# Patient Record
Sex: Female | Born: 1980 | Race: Black or African American | Hispanic: No | Marital: Married | Smoking: Never smoker
Health system: Southern US, Community
[De-identification: ages and names within clinical notes are randomized; demographics above are authoritative.]

## PROBLEM LIST (undated history)

## (undated) DIAGNOSIS — Z789 Other specified health status: Secondary | ICD-10-CM

---

## 2001-08-22 ENCOUNTER — Emergency Department (HOSPITAL_COMMUNITY): Admission: EM | Admit: 2001-08-22 | Discharge: 2001-08-22 | Payer: Self-pay | Admitting: Emergency Medicine

## 2001-08-23 ENCOUNTER — Emergency Department (HOSPITAL_COMMUNITY): Admission: EM | Admit: 2001-08-23 | Discharge: 2001-08-23 | Payer: Self-pay | Admitting: Emergency Medicine

## 2007-06-27 ENCOUNTER — Other Ambulatory Visit: Admission: RE | Admit: 2007-06-27 | Discharge: 2007-06-27 | Payer: Self-pay | Admitting: Family Medicine

## 2008-06-30 ENCOUNTER — Other Ambulatory Visit: Admission: RE | Admit: 2008-06-30 | Discharge: 2008-06-30 | Payer: Self-pay | Admitting: Family Medicine

## 2009-05-19 ENCOUNTER — Emergency Department (HOSPITAL_COMMUNITY): Admission: EM | Admit: 2009-05-19 | Discharge: 2009-05-19 | Payer: Self-pay | Admitting: Emergency Medicine

## 2009-07-15 ENCOUNTER — Ambulatory Visit (HOSPITAL_COMMUNITY): Admission: RE | Admit: 2009-07-15 | Discharge: 2009-07-15 | Payer: Self-pay | Admitting: Obstetrics & Gynecology

## 2009-08-05 ENCOUNTER — Ambulatory Visit (HOSPITAL_COMMUNITY): Admission: RE | Admit: 2009-08-05 | Discharge: 2009-08-05 | Payer: Self-pay | Admitting: Obstetrics & Gynecology

## 2009-11-19 ENCOUNTER — Inpatient Hospital Stay (HOSPITAL_COMMUNITY): Admission: AD | Admit: 2009-11-19 | Discharge: 2009-11-21 | Payer: Self-pay | Admitting: Obstetrics and Gynecology

## 2009-11-19 ENCOUNTER — Ambulatory Visit: Payer: Self-pay | Admitting: Obstetrics and Gynecology

## 2009-11-19 DIAGNOSIS — Z22330 Carrier of Group B streptococcus: Secondary | ICD-10-CM

## 2009-11-19 DIAGNOSIS — O9989 Other specified diseases and conditions complicating pregnancy, childbirth and the puerperium: Secondary | ICD-10-CM

## 2009-11-19 DIAGNOSIS — O99891 Other specified diseases and conditions complicating pregnancy: Secondary | ICD-10-CM

## 2009-11-19 DIAGNOSIS — O4700 False labor before 37 completed weeks of gestation, unspecified trimester: Secondary | ICD-10-CM

## 2010-01-19 ENCOUNTER — Inpatient Hospital Stay (HOSPITAL_COMMUNITY): Admission: AD | Admit: 2010-01-19 | Discharge: 2010-01-22 | Payer: Self-pay | Admitting: Obstetrics & Gynecology

## 2010-01-19 ENCOUNTER — Encounter (INDEPENDENT_AMBULATORY_CARE_PROVIDER_SITE_OTHER): Payer: Self-pay | Admitting: Obstetrics & Gynecology

## 2010-01-21 ENCOUNTER — Encounter
Admission: RE | Admit: 2010-01-21 | Discharge: 2010-02-20 | Payer: Self-pay | Source: Home / Self Care | Admitting: Obstetrics and Gynecology

## 2010-02-21 ENCOUNTER — Encounter
Admission: RE | Admit: 2010-02-21 | Discharge: 2010-03-23 | Payer: Self-pay | Source: Home / Self Care | Attending: Obstetrics and Gynecology | Admitting: Obstetrics and Gynecology

## 2010-03-24 ENCOUNTER — Encounter
Admission: RE | Admit: 2010-03-24 | Discharge: 2010-04-20 | Payer: Self-pay | Source: Home / Self Care | Attending: Obstetrics and Gynecology | Admitting: Obstetrics and Gynecology

## 2010-04-24 ENCOUNTER — Encounter (HOSPITAL_COMMUNITY)
Admission: RE | Admit: 2010-04-24 | Discharge: 2010-04-24 | Disposition: A | Discharge status: Home / Self Care | Payer: BC Managed Care – PPO | Source: Ambulatory Visit | Attending: Obstetrics and Gynecology | Admitting: Obstetrics and Gynecology

## 2010-04-24 DIAGNOSIS — O923 Agalactia: Secondary | ICD-10-CM | POA: Insufficient documentation

## 2010-06-01 LAB — CBC
HCT: 23.3 % — ABNORMAL LOW (ref 36.0–46.0)
Hemoglobin: 10.6 g/dL — ABNORMAL LOW (ref 12.0–15.0)
MCH: 28 pg (ref 26.0–34.0)
MCHC: 33.1 g/dL (ref 30.0–36.0)
MCV: 84.5 fL (ref 78.0–100.0)
Platelets: 213 10*3/uL (ref 150–400)
RDW: 16.3 % — ABNORMAL HIGH (ref 11.5–15.5)
WBC: 14.1 10*3/uL — ABNORMAL HIGH (ref 4.0–10.5)

## 2010-06-01 LAB — RPR: RPR Ser Ql: NONREACTIVE

## 2010-06-03 LAB — URINALYSIS, ROUTINE W REFLEX MICROSCOPIC
Bilirubin Urine: NEGATIVE
Glucose, UA: NEGATIVE mg/dL
Ketones, ur: 40 mg/dL — AB
Leukocytes, UA: NEGATIVE
Nitrite: NEGATIVE
Protein, ur: NEGATIVE mg/dL
Urobilinogen, UA: 0.2 mg/dL (ref 0.0–1.0)
pH: 6 (ref 5.0–8.0)

## 2010-06-03 LAB — CBC
MCH: 27.9 pg (ref 26.0–34.0)
MCV: 85.4 fL (ref 78.0–100.0)
RBC: 3.73 MIL/uL — ABNORMAL LOW (ref 3.87–5.11)
RDW: 14.4 % (ref 11.5–15.5)

## 2010-06-03 LAB — URINE MICROSCOPIC-ADD ON

## 2010-06-03 LAB — FETAL FIBRONECTIN: Fetal Fibronectin: POSITIVE — AB

## 2010-06-08 ENCOUNTER — Other Ambulatory Visit: Payer: Self-pay | Admitting: Obstetrics and Gynecology

## 2010-06-09 ENCOUNTER — Other Ambulatory Visit: Payer: Self-pay | Admitting: Obstetrics and Gynecology

## 2010-06-09 DIAGNOSIS — N63 Unspecified lump in unspecified breast: Secondary | ICD-10-CM

## 2010-06-14 ENCOUNTER — Ambulatory Visit
Admission: RE | Admit: 2010-06-14 | Discharge: 2010-06-14 | Disposition: A | Discharge status: Home / Self Care | Payer: BC Managed Care – PPO | Source: Ambulatory Visit | Attending: Obstetrics and Gynecology | Admitting: Obstetrics and Gynecology

## 2010-06-14 DIAGNOSIS — N63 Unspecified lump in unspecified breast: Secondary | ICD-10-CM

## 2010-06-14 LAB — DIFFERENTIAL
Lymphocytes Relative: 17 % (ref 12–46)
Monocytes Absolute: 0.7 10*3/uL (ref 0.1–1.0)
Monocytes Relative: 6 % (ref 3–12)
Neutro Abs: 8.4 10*3/uL — ABNORMAL HIGH (ref 1.7–7.7)

## 2010-06-14 LAB — WET PREP, GENITAL: Yeast Wet Prep HPF POC: NONE SEEN

## 2010-06-14 LAB — CBC
MCHC: 31.5 g/dL (ref 30.0–36.0)
Platelets: 286 10*3/uL (ref 150–400)
RBC: 4.76 MIL/uL (ref 3.87–5.11)
RDW: 14.3 % (ref 11.5–15.5)
WBC: 11.1 10*3/uL — ABNORMAL HIGH (ref 4.0–10.5)

## 2010-06-14 LAB — PREGNANCY, URINE: Preg Test, Ur: POSITIVE

## 2010-06-14 LAB — BASIC METABOLIC PANEL
BUN: 9 mg/dL (ref 6–23)
CO2: 22 mEq/L (ref 19–32)
Chloride: 104 mEq/L (ref 96–112)
Potassium: 3.4 mEq/L — ABNORMAL LOW (ref 3.5–5.1)

## 2010-06-14 LAB — URINALYSIS, ROUTINE W REFLEX MICROSCOPIC
Bilirubin Urine: NEGATIVE
Nitrite: NEGATIVE
Urobilinogen, UA: 0.2 mg/dL (ref 0.0–1.0)

## 2010-06-14 LAB — HCG, QUANTITATIVE, PREGNANCY: hCG, Beta Chain, Quant, S: 821 m[IU]/mL — ABNORMAL HIGH (ref <5)

## 2010-07-01 IMAGING — US US OB NUCHAL TRANSLUCENCY 1ST GEST
1 series · 14 of 27 positions shown · non-contrast
Comparison: none

OBSTETRICAL ULTRASOUND:
 This ultrasound was performed in The [HOSPITAL], and the [facility] report will be stored to [REDACTED] PACS.  This report is also available in [HOSPITAL]?s accessANYware.

[Series 1: us ob nuchal translucency 1st gest · 14 of 27 slices shown]
[im 1/27]
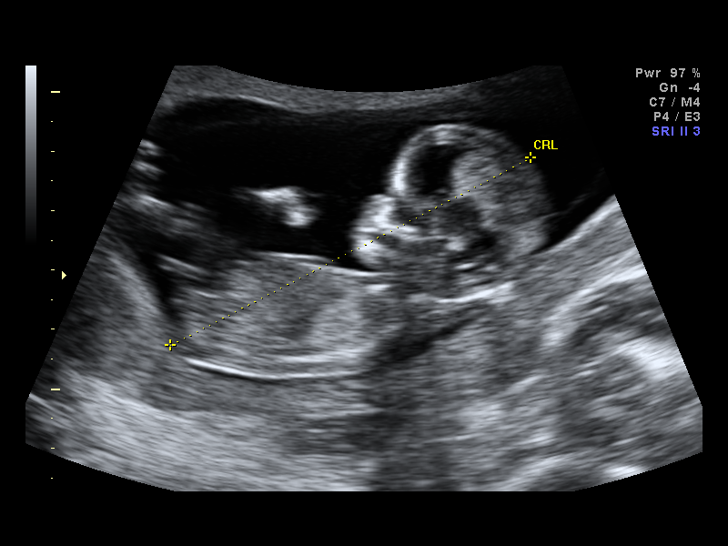
[im 3/27]
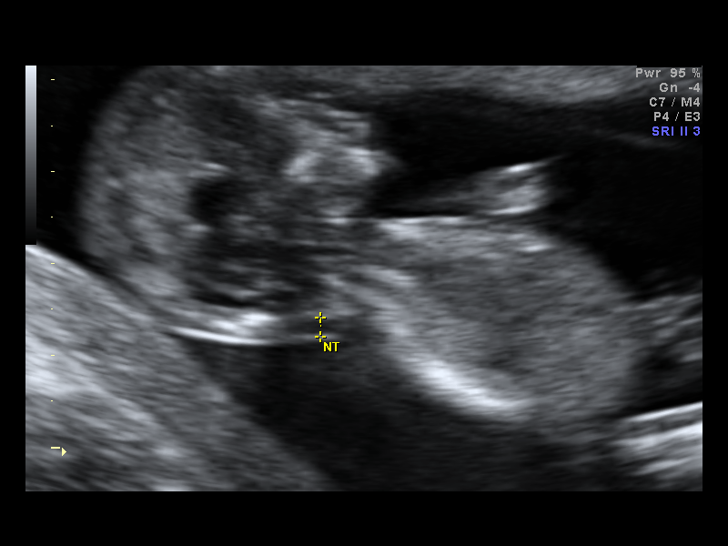
[im 5/27]
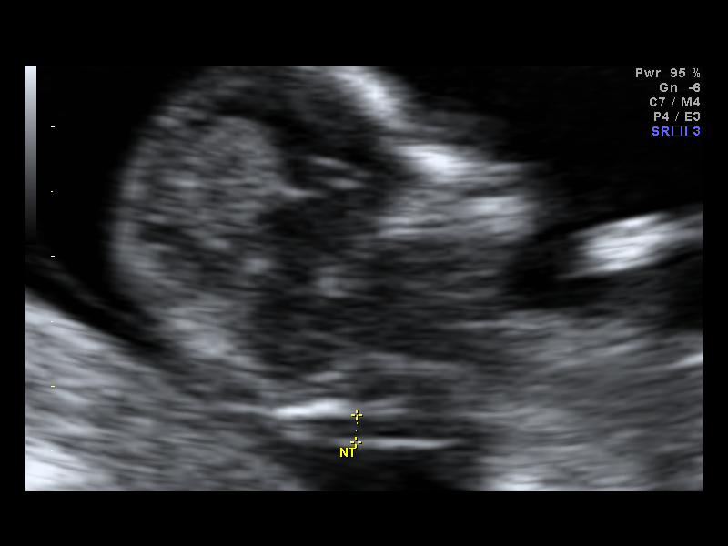
[im 7/27]
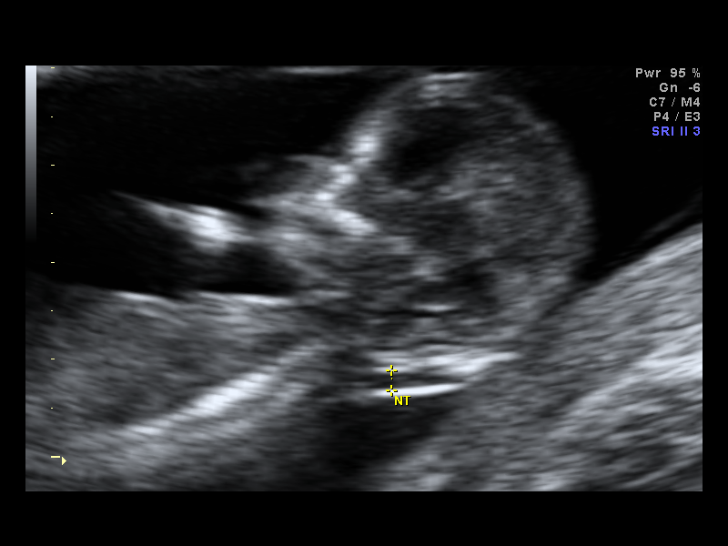
[im 9/27]
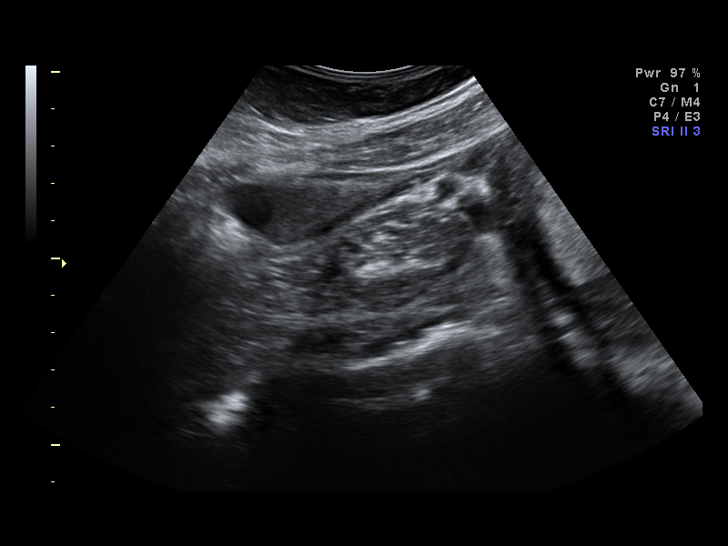
[im 11/27]
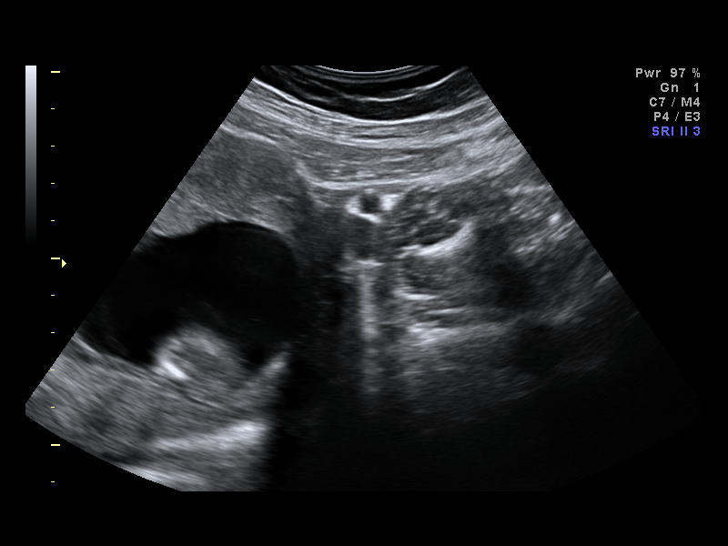
[im 13/27]
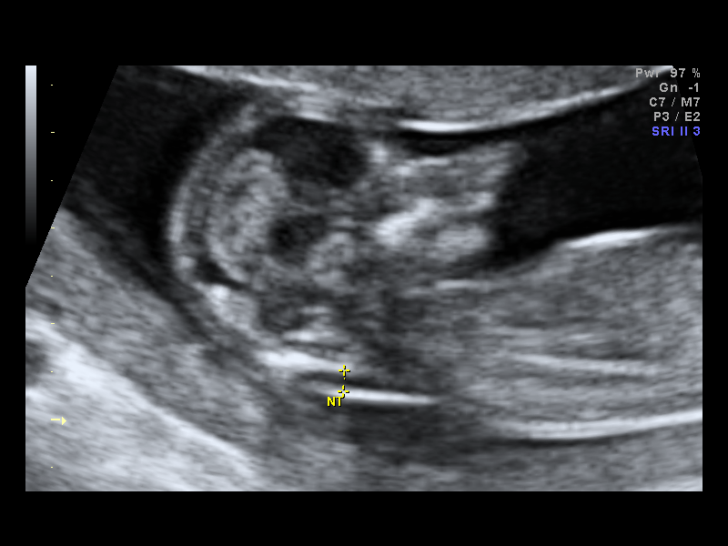
[im 15/27]
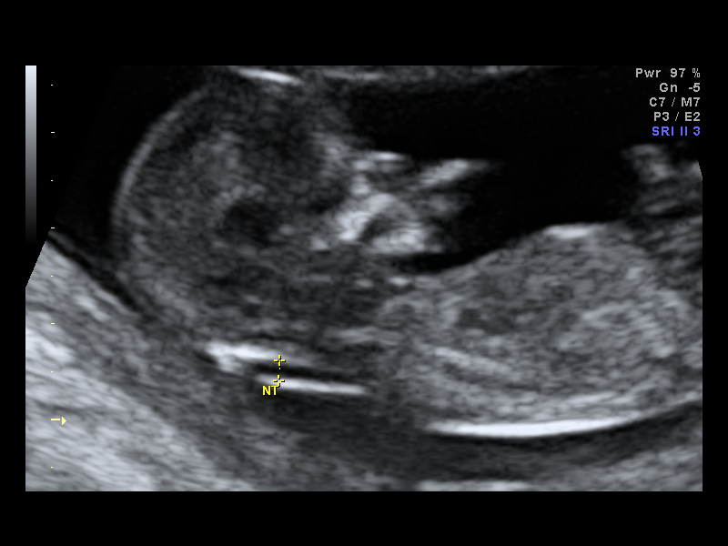
[im 17/27]
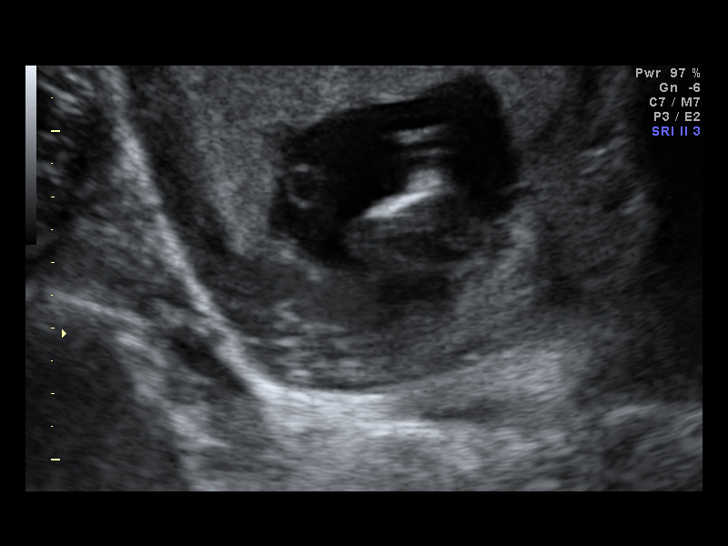
[im 19/27]
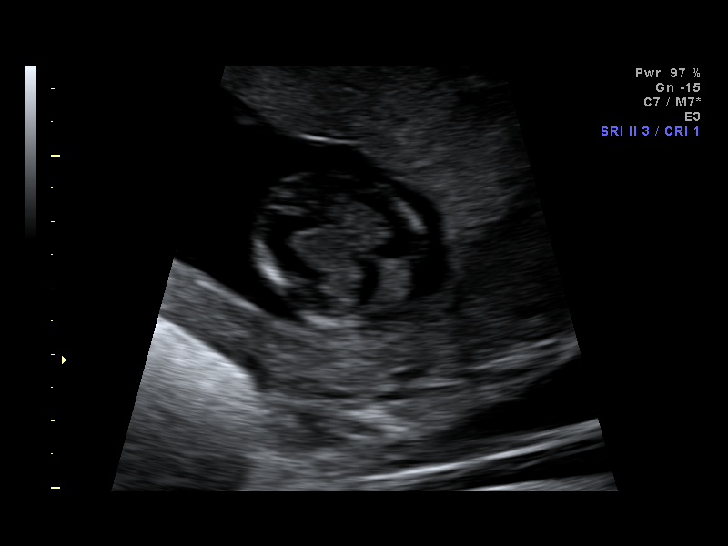
[im 21/27]
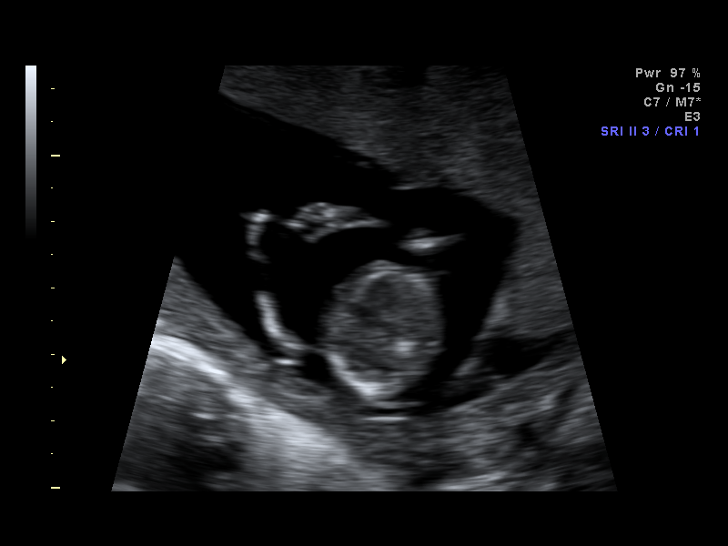
[im 23/27]
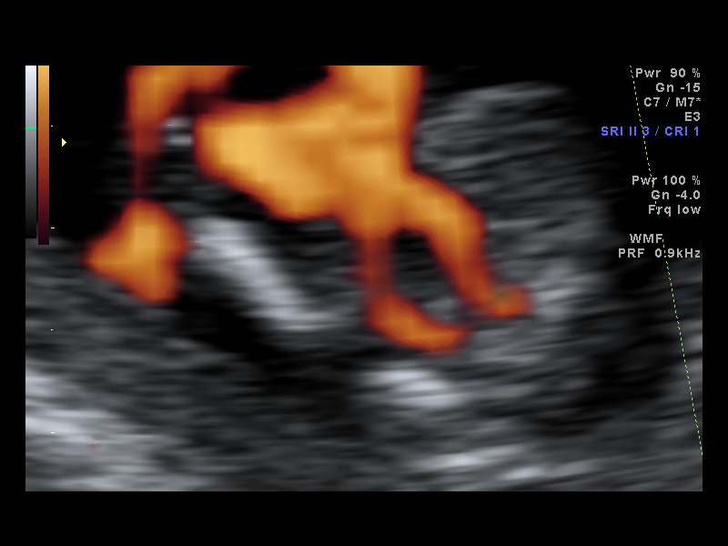
[im 25/27]
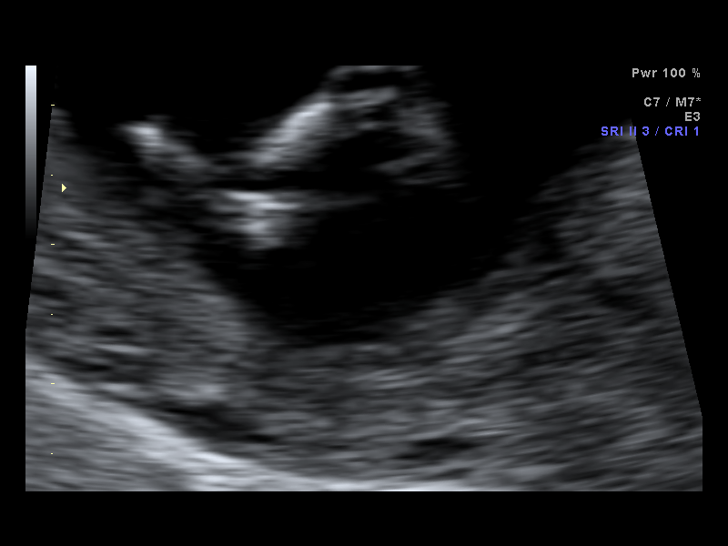
[im 27/27]
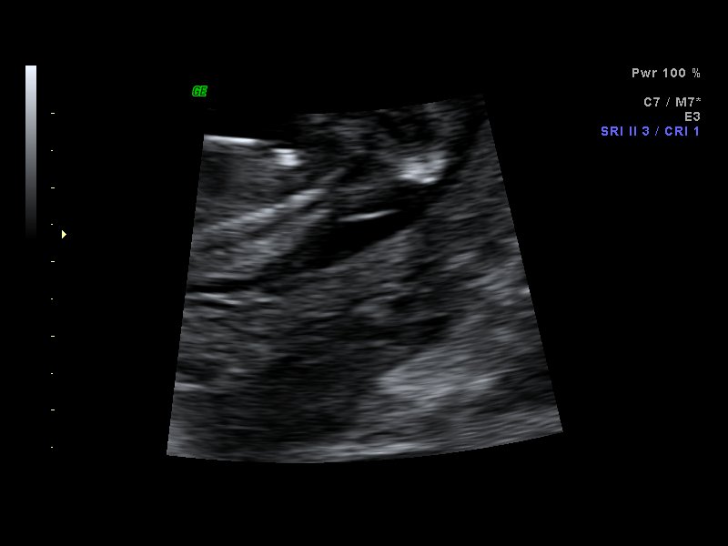

[14 of 27 positions shown; findings below may reference images not displayed]

IMPRESSION: [facility] has also been faxed to the ordering physician.

## 2010-11-05 IMAGING — US US OB COMP +14 WK
1 series · 14 of 28 positions shown · non-contrast
Comparison: none

OBSTETRICAL ULTRASOUND:
 This ultrasound exam was performed in the [HOSPITAL] Ultrasound Department.  The OB US report was generated in the [facility] system, and faxed to the ordering physician.  This report is also available in [HOSPITAL]?s AccessANYware and in [REDACTED] PACS.

[Series 1: us ob comp +14 wk · 0.41mm/px · 48 acquisitions, 14 frames shown]
[im 2/48]
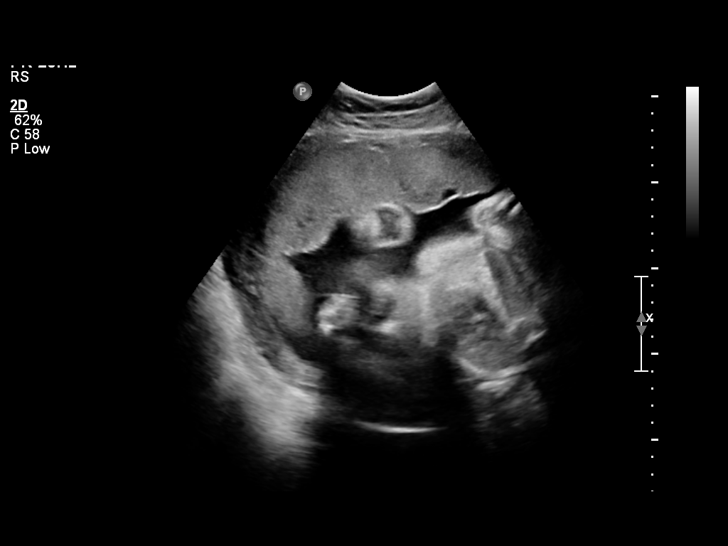
[im 6/48]
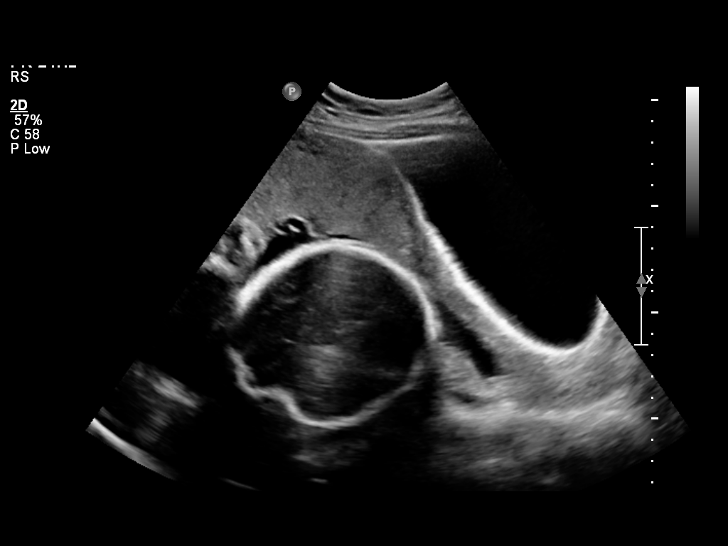
[im 9/48]
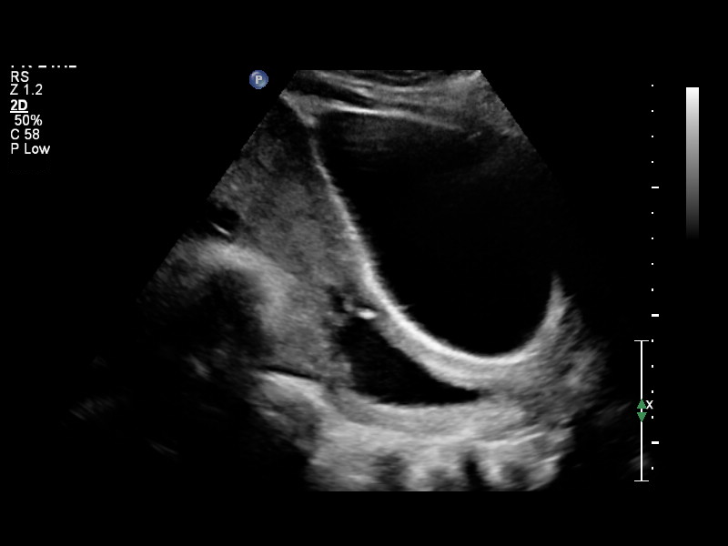
[im 13/48]
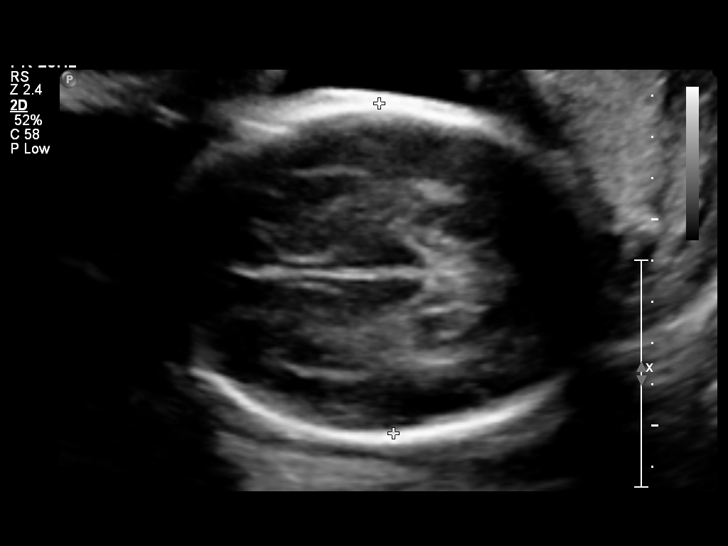
[im 16/48]
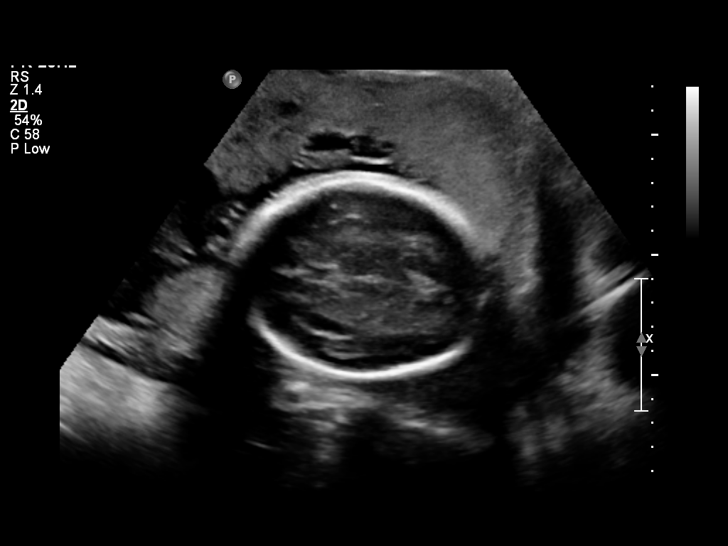
[im 20/48]
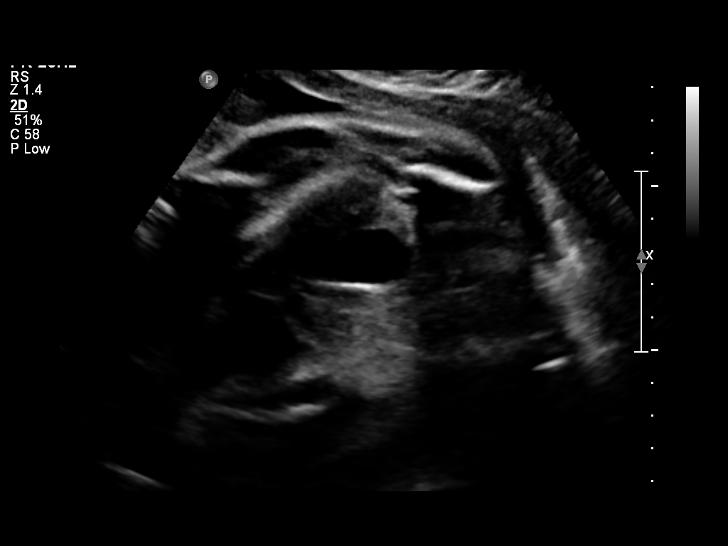
[im 23/48]
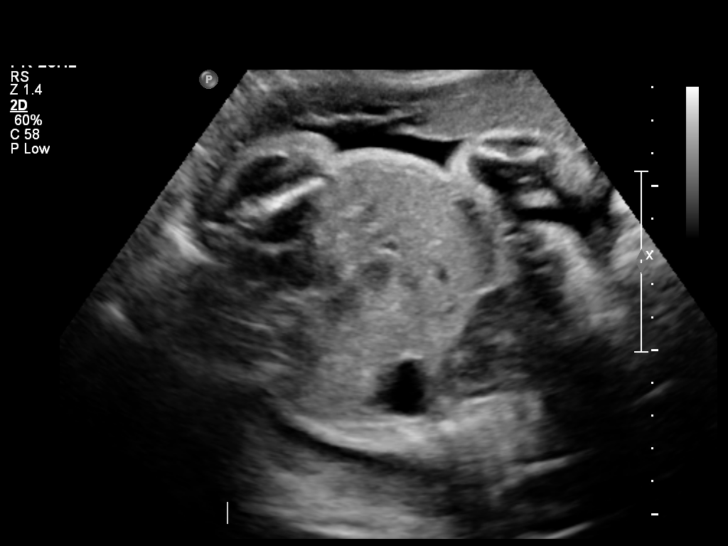
[im 27/48]
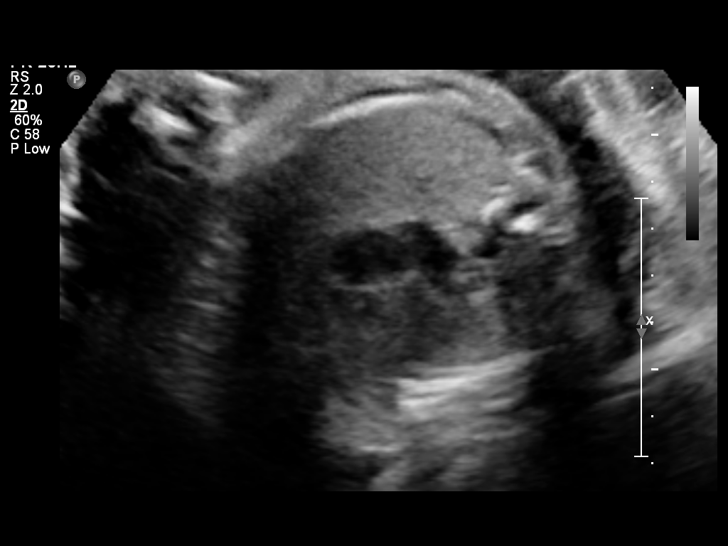
[im 30/48]
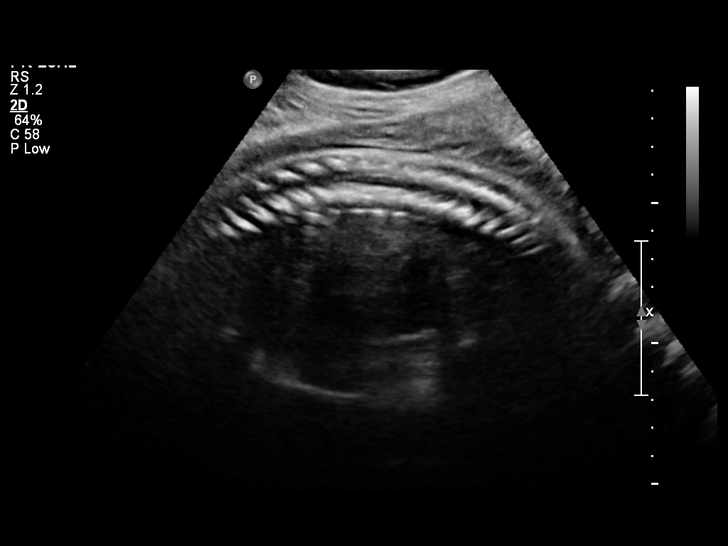
[im 34/48]
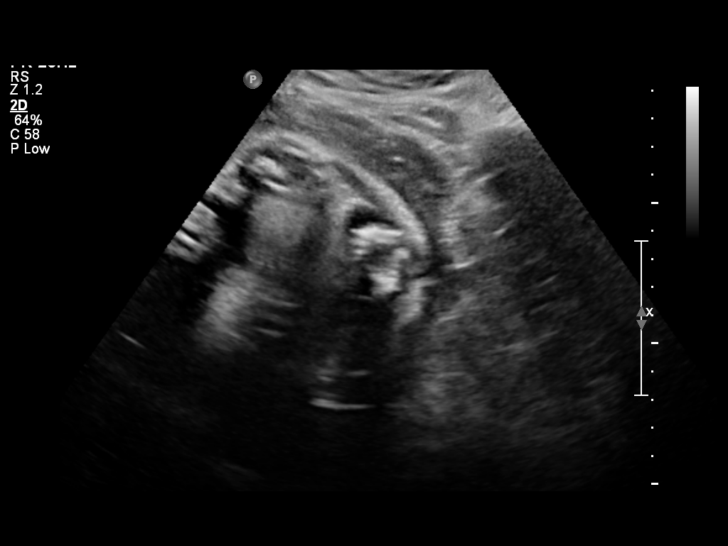
[im 37/48]
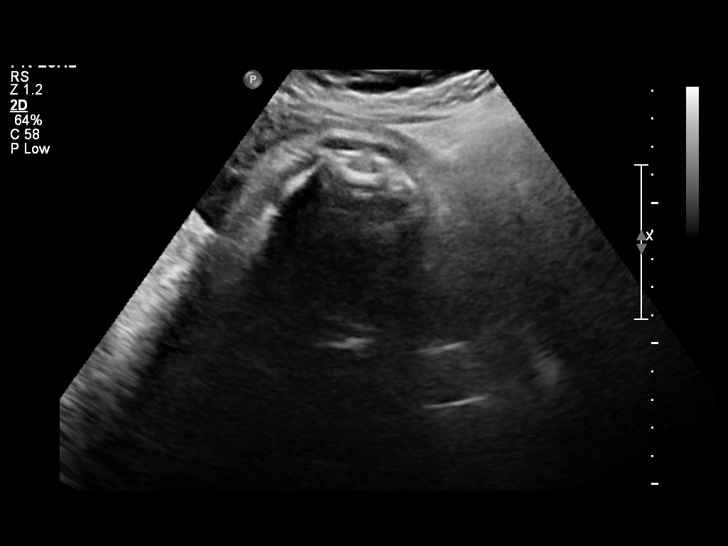
[im 41/48]
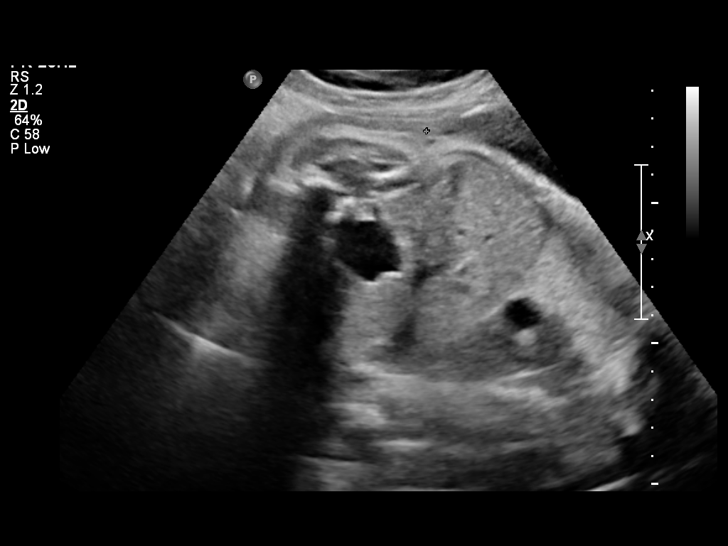
[im 44/48]
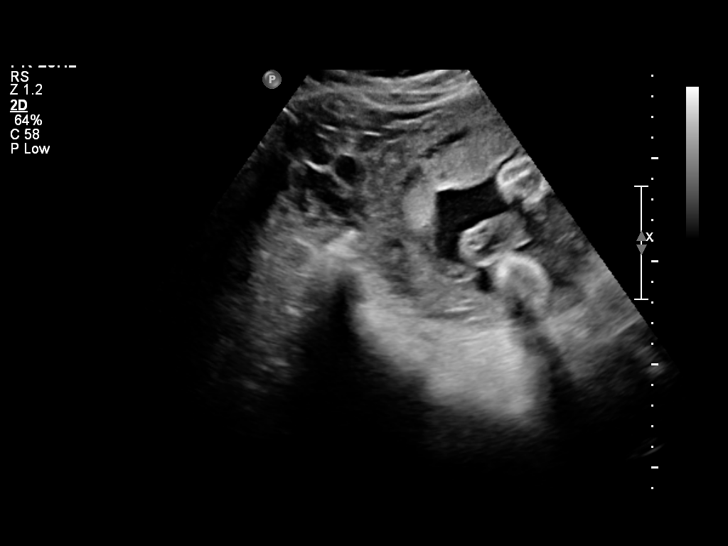
[im 48/48]
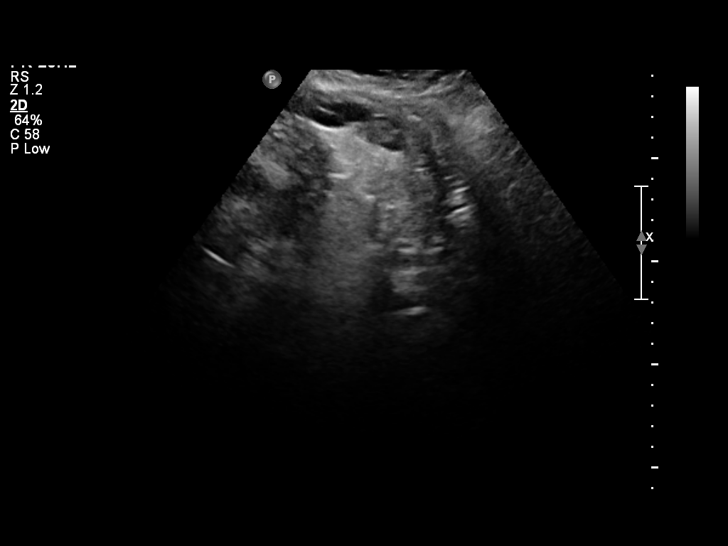

[14 of 28 positions shown; findings below may reference images not displayed]

IMPRESSION: See [facility] Obstetric US report.

## 2012-06-18 ENCOUNTER — Other Ambulatory Visit: Payer: Self-pay | Admitting: Obstetrics and Gynecology

## 2013-06-13 LAB — OB RESULTS CONSOLE HEPATITIS B SURFACE ANTIGEN: HEP B S AG: NEGATIVE

## 2013-06-13 LAB — OB RESULTS CONSOLE ANTIBODY SCREEN: ANTIBODY SCREEN: NEGATIVE

## 2013-06-13 LAB — OB RESULTS CONSOLE ABO/RH: RH Type: POSITIVE

## 2013-06-13 LAB — OB RESULTS CONSOLE HIV ANTIBODY (ROUTINE TESTING): HIV: NONREACTIVE

## 2013-06-13 LAB — OB RESULTS CONSOLE RUBELLA ANTIBODY, IGM: Rubella: IMMUNE

## 2013-06-13 LAB — OB RESULTS CONSOLE RPR: RPR: NONREACTIVE

## 2013-11-29 ENCOUNTER — Encounter (HOSPITAL_COMMUNITY): Payer: Self-pay

## 2013-12-10 ENCOUNTER — Encounter (HOSPITAL_COMMUNITY): Payer: Self-pay

## 2013-12-11 ENCOUNTER — Inpatient Hospital Stay (HOSPITAL_COMMUNITY): Admission: RE | Admit: 2013-12-11 | Payer: BC Managed Care – PPO | Source: Ambulatory Visit

## 2013-12-11 ENCOUNTER — Encounter (HOSPITAL_COMMUNITY): Payer: Self-pay

## 2013-12-11 ENCOUNTER — Encounter (HOSPITAL_COMMUNITY)
Admission: RE | Admit: 2013-12-11 | Discharge: 2013-12-11 | Disposition: A | Discharge status: Home / Self Care | Payer: BC Managed Care – PPO | Source: Ambulatory Visit | Attending: Obstetrics and Gynecology | Admitting: Obstetrics and Gynecology

## 2013-12-11 HISTORY — DX: Other specified health status: Z78.9

## 2013-12-11 LAB — CBC
HCT: 30.6 % — ABNORMAL LOW (ref 36.0–46.0)
Hemoglobin: 9.8 g/dL — ABNORMAL LOW (ref 12.0–15.0)
MCH: 26.4 pg (ref 26.0–34.0)
MCHC: 32 g/dL (ref 30.0–36.0)
MCV: 82.5 fL (ref 78.0–100.0)
Platelets: 254 10*3/uL (ref 150–400)
RBC: 3.71 MIL/uL — ABNORMAL LOW (ref 3.87–5.11)
RDW: 16.1 % — AB (ref 11.5–15.5)
WBC: 10 10*3/uL (ref 4.0–10.5)

## 2013-12-11 LAB — RPR

## 2013-12-11 LAB — TYPE AND SCREEN
ABO/RH(D): A POS
ANTIBODY SCREEN: NEGATIVE

## 2013-12-11 LAB — ABO/RH: ABO/RH(D): A POS

## 2013-12-11 NOTE — Patient Instructions (Addendum)
Your procedure is scheduled on:12/12/13  Enter through the Main Entrance at :10 am Pick up desk phone and dial [phone] and inform us of your arrival.  Please call [phone] if you have any problems the morning of surgery.  Remember: Do not eat food after midnight:tonight Water ok until:7am Thursday   You may brush your teeth the morning of surgery.   DO NOT wear jewelry, eye make-up, lipstick,body lotion, or dark fingernail polish.  (Polished toes are ok) You may wear deodorant.  If you are to be admitted after surgery, leave suitcase in car until your room has been assigned. Patients discharged on the day of surgery will not be allowed to drive home. Wear loose fitting, comfortable clothes for your ride home.

## 2013-12-12 ENCOUNTER — Inpatient Hospital Stay (HOSPITAL_COMMUNITY): Payer: BC Managed Care – PPO | Admitting: Anesthesiology

## 2013-12-12 ENCOUNTER — Inpatient Hospital Stay (HOSPITAL_COMMUNITY)
Admission: RE | Admit: 2013-12-12 | Discharge: 2013-12-14 | DRG: 766 | Disposition: A | Discharge status: Home / Self Care | Payer: BC Managed Care – PPO | Source: Ambulatory Visit | Attending: Obstetrics and Gynecology | Admitting: Obstetrics and Gynecology

## 2013-12-12 ENCOUNTER — Encounter (HOSPITAL_COMMUNITY)
Admission: RE | Disposition: A | Discharge status: Home / Self Care | Payer: Self-pay | Source: Ambulatory Visit | Attending: Obstetrics and Gynecology

## 2013-12-12 ENCOUNTER — Encounter (HOSPITAL_COMMUNITY): Payer: Self-pay

## 2013-12-12 ENCOUNTER — Encounter (HOSPITAL_COMMUNITY): Payer: BC Managed Care – PPO | Admitting: Anesthesiology

## 2013-12-12 DIAGNOSIS — O99214 Obesity complicating childbirth: Secondary | ICD-10-CM

## 2013-12-12 DIAGNOSIS — Z6833 Body mass index (BMI) 33.0-33.9, adult: Secondary | ICD-10-CM

## 2013-12-12 DIAGNOSIS — D649 Anemia, unspecified: Secondary | ICD-10-CM | POA: Diagnosis present

## 2013-12-12 DIAGNOSIS — O9902 Anemia complicating childbirth: Secondary | ICD-10-CM | POA: Diagnosis present

## 2013-12-12 DIAGNOSIS — O34219 Maternal care for unspecified type scar from previous cesarean delivery: Principal | ICD-10-CM | POA: Diagnosis present

## 2013-12-12 DIAGNOSIS — E669 Obesity, unspecified: Secondary | ICD-10-CM | POA: Diagnosis present

## 2013-12-12 SURGERY — Surgical Case
Anesthesia: Spinal | Site: Abdomen

## 2013-12-12 MED ORDER — CEFAZOLIN SODIUM-DEXTROSE 2-3 GM-% IV SOLR
INTRAVENOUS | Status: AC
  Filled 2013-12-12: qty 50

## 2013-12-12 MED ORDER — ONDANSETRON HCL 4 MG/2ML IJ SOLN
INTRAMUSCULAR | Status: AC
  Filled 2013-12-12: qty 2

## 2013-12-12 MED ORDER — SENNOSIDES-DOCUSATE SODIUM 8.6-50 MG PO TABS
2.0000 | ORAL_TABLET | ORAL | Status: DC
  Administered 2013-12-12 – 2013-12-14 (×2): 2 via ORAL
  Filled 2013-12-12 (×2): qty 2

## 2013-12-12 MED ORDER — ONDANSETRON HCL 4 MG PO TABS: 4.0000 mg | ORAL_TABLET | ORAL | Status: DC | PRN

## 2013-12-12 MED ORDER — MEPERIDINE HCL 25 MG/ML IJ SOLN: 6.2500 mg | INTRAMUSCULAR | Status: DC | PRN

## 2013-12-12 MED ORDER — SODIUM CHLORIDE 0.9 % IJ SOLN: 3.0000 mL | INTRAMUSCULAR | Status: DC | PRN

## 2013-12-12 MED ORDER — NALBUPHINE HCL 10 MG/ML IJ SOLN: 5.0000 mg | Freq: Once | INTRAMUSCULAR | Status: AC | PRN

## 2013-12-12 MED ORDER — PHENYLEPHRINE 8 MG IN D5W 100 ML (0.08MG/ML) PREMIX OPTIME
INJECTION | INTRAVENOUS | Status: AC
  Filled 2013-12-12: qty 100

## 2013-12-12 MED ORDER — MORPHINE SULFATE 0.5 MG/ML IJ SOLN
INTRAMUSCULAR | Status: AC
  Filled 2013-12-12: qty 10

## 2013-12-12 MED ORDER — DIPHENHYDRAMINE HCL 25 MG PO CAPS
25.0000 mg | ORAL_CAPSULE | Freq: Four times a day (QID) | ORAL | Status: DC | PRN
  Filled 2013-12-12: qty 1

## 2013-12-12 MED ORDER — KETOROLAC TROMETHAMINE 30 MG/ML IJ SOLN
30.0000 mg | Freq: Four times a day (QID) | INTRAMUSCULAR | Status: AC | PRN
Start: 2013-12-12 — End: 2013-12-13

## 2013-12-12 MED ORDER — FENTANYL CITRATE 0.05 MG/ML IJ SOLN
INTRAMUSCULAR | Status: AC
  Administered 2013-12-12: 50 ug via INTRAVENOUS
  Filled 2013-12-12: qty 2

## 2013-12-12 MED ORDER — MORPHINE SULFATE (PF) 0.5 MG/ML IJ SOLN
INTRAMUSCULAR | Status: DC | PRN
  Administered 2013-12-12: .1 mg via INTRATHECAL

## 2013-12-12 MED ORDER — NALBUPHINE HCL 10 MG/ML IJ SOLN
5.0000 mg | Freq: Once | INTRAMUSCULAR | Status: AC | PRN
Start: 2013-12-12 — End: 2013-12-12

## 2013-12-12 MED ORDER — FENTANYL CITRATE 0.05 MG/ML IJ SOLN
INTRAMUSCULAR | Status: DC | PRN
  Administered 2013-12-12: 25 ug via INTRATHECAL

## 2013-12-12 MED ORDER — ONDANSETRON HCL 4 MG/2ML IJ SOLN: 4.0000 mg | INTRAMUSCULAR | Status: DC | PRN

## 2013-12-12 MED ORDER — OXYTOCIN 40 UNITS IN LACTATED RINGERS INFUSION - SIMPLE MED: 62.5000 mL/h | INTRAVENOUS | Status: AC

## 2013-12-12 MED ORDER — ONDANSETRON HCL 4 MG/2ML IJ SOLN: 4.0000 mg | Freq: Three times a day (TID) | INTRAMUSCULAR | Status: DC | PRN

## 2013-12-12 MED ORDER — LANOLIN HYDROUS EX OINT: 1.0000 "application " | TOPICAL_OINTMENT | CUTANEOUS | Status: DC | PRN

## 2013-12-12 MED ORDER — NALBUPHINE HCL 10 MG/ML IJ SOLN: 5.0000 mg | INTRAMUSCULAR | Status: DC | PRN

## 2013-12-12 MED ORDER — LACTATED RINGERS IV SOLN
Freq: Once | INTRAVENOUS | Status: AC
  Administered 2013-12-12 (×2): via INTRAVENOUS

## 2013-12-12 MED ORDER — OXYCODONE-ACETAMINOPHEN 5-325 MG PO TABS
2.0000 | ORAL_TABLET | ORAL | Status: DC | PRN
  Filled 2013-12-12: qty 2

## 2013-12-12 MED ORDER — SCOPOLAMINE 1 MG/3DAYS TD PT72
MEDICATED_PATCH | TRANSDERMAL | Status: AC
  Administered 2013-12-12: 1.5 mg via TRANSDERMAL
  Filled 2013-12-12: qty 1

## 2013-12-12 MED ORDER — DIPHENHYDRAMINE HCL 50 MG/ML IJ SOLN: 12.5000 mg | INTRAMUSCULAR | Status: DC | PRN

## 2013-12-12 MED ORDER — ONDANSETRON HCL 4 MG/2ML IJ SOLN
INTRAMUSCULAR | Status: DC | PRN
  Administered 2013-12-12: 4 mg via INTRAVENOUS

## 2013-12-12 MED ORDER — SIMETHICONE 80 MG PO CHEW: 80.0000 mg | CHEWABLE_TABLET | ORAL | Status: DC | PRN

## 2013-12-12 MED ORDER — DIPHENHYDRAMINE HCL 25 MG PO CAPS
25.0000 mg | ORAL_CAPSULE | ORAL | Status: DC | PRN
  Administered 2013-12-13: 25 mg via ORAL

## 2013-12-12 MED ORDER — NALOXONE HCL 0.4 MG/ML IJ SOLN: 0.4000 mg | INTRAMUSCULAR | Status: DC | PRN

## 2013-12-12 MED ORDER — NALOXONE HCL 1 MG/ML IJ SOLN
1.0000 ug/kg/h | INTRAVENOUS | Status: DC | PRN
  Filled 2013-12-12: qty 2

## 2013-12-12 MED ORDER — LACTATED RINGERS IV SOLN
INTRAVENOUS | Status: DC
  Administered 2013-12-12 – 2013-12-13 (×2): via INTRAVENOUS

## 2013-12-12 MED ORDER — DIBUCAINE 1 % RE OINT: 1.0000 "application " | TOPICAL_OINTMENT | RECTAL | Status: DC | PRN

## 2013-12-12 MED ORDER — OXYTOCIN 10 UNIT/ML IJ SOLN
INTRAMUSCULAR | Status: AC
  Filled 2013-12-12: qty 4

## 2013-12-12 MED ORDER — DEXAMETHASONE SODIUM PHOSPHATE 4 MG/ML IJ SOLN
INTRAMUSCULAR | Status: AC
  Filled 2013-12-12: qty 1

## 2013-12-12 MED ORDER — FENTANYL CITRATE 0.05 MG/ML IJ SOLN
25.0000 ug | INTRAMUSCULAR | Status: DC | PRN
  Administered 2013-12-12: 25 ug via INTRAVENOUS
  Administered 2013-12-12: 50 ug via INTRAVENOUS

## 2013-12-12 MED ORDER — SCOPOLAMINE 1 MG/3DAYS TD PT72
1.0000 | MEDICATED_PATCH | Freq: Once | TRANSDERMAL | Status: AC
  Administered 2013-12-12: 1.5 mg via TRANSDERMAL

## 2013-12-12 MED ORDER — KETOROLAC TROMETHAMINE 30 MG/ML IJ SOLN
INTRAMUSCULAR | Status: AC
  Administered 2013-12-12: 30 mg via INTRAMUSCULAR
  Filled 2013-12-12: qty 1

## 2013-12-12 MED ORDER — KETOROLAC TROMETHAMINE 30 MG/ML IJ SOLN
30.0000 mg | Freq: Four times a day (QID) | INTRAMUSCULAR | Status: AC | PRN
  Administered 2013-12-12: 30 mg via INTRAMUSCULAR

## 2013-12-12 MED ORDER — PRENATAL MULTIVITAMIN CH
1.0000 | ORAL_TABLET | Freq: Every day | ORAL | Status: DC
  Administered 2013-12-13 – 2013-12-14 (×2): 1 via ORAL
  Filled 2013-12-12 (×2): qty 1

## 2013-12-12 MED ORDER — CEFAZOLIN SODIUM-DEXTROSE 2-3 GM-% IV SOLR
2.0000 g | Freq: Once | INTRAVENOUS | Status: AC
  Administered 2013-12-12: 2 g via INTRAVENOUS
  Filled 2013-12-12: qty 50

## 2013-12-12 MED ORDER — SIMETHICONE 80 MG PO CHEW
80.0000 mg | CHEWABLE_TABLET | Freq: Three times a day (TID) | ORAL | Status: DC
  Administered 2013-12-12 – 2013-12-14 (×5): 80 mg via ORAL
  Filled 2013-12-12 (×5): qty 1

## 2013-12-12 MED ORDER — PHENYLEPHRINE 8 MG IN D5W 100 ML (0.08MG/ML) PREMIX OPTIME
INJECTION | INTRAVENOUS | Status: DC | PRN
  Administered 2013-12-12: 60 ug/min via INTRAVENOUS

## 2013-12-12 MED ORDER — OXYCODONE-ACETAMINOPHEN 5-325 MG PO TABS
1.0000 | ORAL_TABLET | ORAL | Status: DC | PRN
  Administered 2013-12-13 (×2): 1 via ORAL
  Filled 2013-12-12 (×4): qty 1

## 2013-12-12 MED ORDER — FENTANYL CITRATE 0.05 MG/ML IJ SOLN
INTRAMUSCULAR | Status: AC
  Filled 2013-12-12: qty 2

## 2013-12-12 MED ORDER — ZOLPIDEM TARTRATE 5 MG PO TABS: 5.0000 mg | ORAL_TABLET | Freq: Every evening | ORAL | Status: DC | PRN

## 2013-12-12 MED ORDER — METOCLOPRAMIDE HCL 5 MG/ML IJ SOLN: 10.0000 mg | Freq: Once | INTRAMUSCULAR | Status: DC | PRN

## 2013-12-12 MED ORDER — METHYLERGONOVINE MALEATE 0.2 MG/ML IJ SOLN: 0.2000 mg | INTRAMUSCULAR | Status: DC | PRN

## 2013-12-12 MED ORDER — DEXAMETHASONE SODIUM PHOSPHATE 4 MG/ML IJ SOLN
INTRAMUSCULAR | Status: DC | PRN
  Administered 2013-12-12: 4 mg via INTRAVENOUS

## 2013-12-12 MED ORDER — METHYLERGONOVINE MALEATE 0.2 MG PO TABS: 0.2000 mg | ORAL_TABLET | ORAL | Status: DC | PRN

## 2013-12-12 MED ORDER — SIMETHICONE 80 MG PO CHEW
80.0000 mg | CHEWABLE_TABLET | ORAL | Status: DC
  Administered 2013-12-12 – 2013-12-14 (×2): 80 mg via ORAL
  Filled 2013-12-12 (×2): qty 1

## 2013-12-12 MED ORDER — MENTHOL 3 MG MT LOZG: 1.0000 | LOZENGE | OROMUCOSAL | Status: DC | PRN

## 2013-12-12 MED ORDER — WITCH HAZEL-GLYCERIN EX PADS: 1.0000 "application " | MEDICATED_PAD | CUTANEOUS | Status: DC | PRN

## 2013-12-12 MED ORDER — LACTATED RINGERS IV SOLN: INTRAVENOUS | Status: DC

## 2013-12-12 MED ORDER — TETANUS-DIPHTH-ACELL PERTUSSIS 5-2.5-18.5 LF-MCG/0.5 IM SUSP
0.5000 mL | Freq: Once | INTRAMUSCULAR | Status: AC
  Administered 2013-12-13: 0.5 mL via INTRAMUSCULAR

## 2013-12-12 MED ORDER — IBUPROFEN 600 MG PO TABS
600.0000 mg | ORAL_TABLET | Freq: Four times a day (QID) | ORAL | Status: DC
  Administered 2013-12-12 – 2013-12-14 (×7): 600 mg via ORAL
  Filled 2013-12-12 (×7): qty 1

## 2013-12-12 SURGICAL SUPPLY — 34 items
ADH SKN CLS APL DERMABOND .7 (GAUZE/BANDAGES/DRESSINGS) ×2
BLADE SURG 10 STRL SS (BLADE) ×6 IMPLANT
CLAMP CORD UMBIL (MISCELLANEOUS) IMPLANT
CLOTH BEACON ORANGE TIMEOUT ST (SAFETY) ×3 IMPLANT
COVER LIGHT HANDLE  1/PK (MISCELLANEOUS) ×4
COVER LIGHT HANDLE 1/PK (MISCELLANEOUS) ×2 IMPLANT
DERMABOND ADVANCED (GAUZE/BANDAGES/DRESSINGS) ×4
DERMABOND ADVANCED .7 DNX12 (GAUZE/BANDAGES/DRESSINGS) IMPLANT
DRAPE SHEET LG 3/4 BI-LAMINATE (DRAPES) IMPLANT
DRSG OPSITE POSTOP 4X10 (GAUZE/BANDAGES/DRESSINGS) ×3 IMPLANT
DURAPREP 26ML APPLICATOR (WOUND CARE) ×3 IMPLANT
ELECT REM PT RETURN 9FT ADLT (ELECTROSURGICAL) ×3
ELECTRODE REM PT RTRN 9FT ADLT (ELECTROSURGICAL) ×1 IMPLANT
EXTRACTOR VACUUM M CUP 4 TUBE (SUCTIONS) IMPLANT
EXTRACTOR VACUUM M CUP 4' TUBE (SUCTIONS)
GLOVE BIO SURGEON STRL SZ7 (GLOVE) ×3 IMPLANT
GOWN STRL REUS W/TWL LRG LVL3 (GOWN DISPOSABLE) ×6 IMPLANT
KIT ABG SYR 3ML LUER SLIP (SYRINGE) IMPLANT
NDL HYPO 25X5/8 SAFETYGLIDE (NEEDLE) IMPLANT
NEEDLE HYPO 25X5/8 SAFETYGLIDE (NEEDLE) IMPLANT
NS IRRIG 1000ML POUR BTL (IV SOLUTION) ×3 IMPLANT
PACK C SECTION WH (CUSTOM PROCEDURE TRAY) ×3 IMPLANT
PAD OB MATERNITY 4.3X12.25 (PERSONAL CARE ITEMS) ×3 IMPLANT
RTRCTR C-SECT PINK 25CM LRG (MISCELLANEOUS) ×3 IMPLANT
STAPLER VISISTAT 35W (STAPLE) IMPLANT
SUT CHROMIC 1 CTX 36 (SUTURE) ×6 IMPLANT
SUT CHROMIC 2 0 CT 1 (SUTURE) ×3 IMPLANT
SUT PDS AB 0 CTX 60 (SUTURE) ×3 IMPLANT
SUT VIC AB 2-0 CT1 27 (SUTURE) ×3
SUT VIC AB 2-0 CT1 TAPERPNT 27 (SUTURE) ×1 IMPLANT
SUT VIC AB 4-0 KS 27 (SUTURE) ×2 IMPLANT
TOWEL OR 17X24 6PK STRL BLUE (TOWEL DISPOSABLE) ×3 IMPLANT
TRAY FOLEY CATH 14FR (SET/KITS/TRAYS/PACK) ×3 IMPLANT
WATER STERILE IRR 1000ML POUR (IV SOLUTION) ×3 IMPLANT

## 2013-12-12 NOTE — Anesthesia Postprocedure Evaluation (Signed)
Anesthesia Post Note  Patient: Doris Mccarthy  Procedure(s) Performed: Procedure(s) (LRB): REPEAT CESAREAN SECTION (N/A)  Anesthesia type: Spinal  Patient location: Mother/Baby  Post pain: Pain level controlled  Post assessment: Post-op Vital signs reviewed  Last Vitals:  Filed Vitals:   12/12/13 1550  BP: 101/55  Pulse: 58  Temp: 36.9 C  Resp: 18    Post vital signs: Reviewed  Level of consciousness: awake  Complications: No apparent anesthesia complications

## 2013-12-12 NOTE — Anesthesia Procedure Notes (Signed)
Spinal Patient location during procedure: OR Preanesthetic Checklist Completed: patient identified, site marked, surgical consent, pre-op evaluation, timeout performed, IV checked, risks and benefits discussed and monitors and equipment checked Spinal Block Patient position: sitting Prep: DuraPrep Patient monitoring: heart rate, cardiac monitor, continuous pulse ox and blood pressure Approach: midline Location: L3-4 Injection technique: single-shot Needle Needle type: Sprotte  Needle gauge: 24 G Needle length: 9 cm Assessment Sensory level: T4 Additional Notes Spinal Dosage in OR  Bupivicaine ml       1.5 PFMS04   mcg        100 Fentanyl mcg            25    

## 2013-12-12 NOTE — H&P (Signed)
Doris Mccarthy is a 33 y.o. female presenting for scheduled repeat cesarean section  33 yo G3P1011 @ 39+0 presents for repeat cesarean section . She has had an uncomplicated pregnancy to this point. History OB History   Grav Para Term Preterm Abortions TAB SAB Ect Mult Living   Past Medical History  Diagnosis Date  . Medical history non-contributory    Past Surgical History  Procedure Laterality Date  . Cesarean section     Family History: family history is not on file. Social History:  reports that she has never smoked. She does not have any smokeless tobacco history on file. She reports that she does not drink alcohol or use illicit drugs.   Prenatal Transfer Tool  Maternal Diabetes: No Genetic Screening: Normal Maternal Ultrasounds/Referrals: Normal Fetal Ultrasounds or other Referrals:  None Maternal Substance Abuse:  No Significant Maternal Medications:  None Significant Maternal Lab Results:  Lab values include: Other:  hGB 10.2 @ 28 WEEKS Other Comments:  None  ROSAS ABOVE    Blood pressure 111/63, pulse 90, temperature 98.1 F (36.7 C), resp. rate 16, SpO2 98.00%. Exam Physical Exam  Prenatal labs: ABO, Rh: --/--/A POS, A POS (09/23 1155) Antibody: NEG (09/23 1155) Rubella: Immune (03/26 0000) RPR: NON REAC (09/23 1155)  HBsAg: Negative (03/26 0000)  HIV: Non-reactive (03/26 0000)  GBS:     Assessment/Plan: 1) aDMIT 2) Consent for repeat cesarean     Ceara Wrightson H. 12/12/2013, 10:42 AM

## 2013-12-12 NOTE — Addendum Note (Signed)
Addendum created 12/12/13 1553 by Algis Greenhouse, CRNA   Modules edited: Notes Section   Notes Section:  File: [id number]

## 2013-12-12 NOTE — Anesthesia Postprocedure Evaluation (Signed)
  Anesthesia Post-op Note  Patient: Doris Mccarthy  Procedure(s) Performed: Procedure(s): REPEAT CESAREAN SECTION (N/A)  Patient is awake, responsive, moving her legs, and has signs of resolution of her numbness. Pain and nausea are reasonably well controlled. Vital signs are stable and clinically acceptable. Oxygen saturation is clinically acceptable. There are no apparent anesthetic complications at this time. Patient is ready for discharge.

## 2013-12-12 NOTE — Transfer of Care (Signed)
Immediate Anesthesia Transfer of Care Note  Patient: Doris Mccarthy  Procedure(s) Performed: Procedure(s): REPEAT CESAREAN SECTION (N/A)  Patient Location: PACU  Anesthesia Type:Spinal  Level of Consciousness: awake, alert , oriented and patient cooperative  Airway & Oxygen Therapy: Patient Spontanous Breathing  Post-op Assessment: Report given to PACU RN and Post -op Vital signs reviewed and stable  Post vital signs: Reviewed and stable  Complications: No apparent anesthesia complications

## 2013-12-12 NOTE — Anesthesia Preprocedure Evaluation (Signed)
Anesthesia Evaluation  Patient identified by MRN, date of birth, ID band Patient awake    Reviewed: Allergy & Precautions, H&P , NPO status , Patient's Chart, lab work & pertinent test results  Airway Mallampati: III TM Distance: >3 FB Neck ROM: Full    Dental no notable dental hx. (+) Teeth Intact   Pulmonary neg pulmonary ROS,  breath sounds clear to auscultation  Pulmonary exam normal       Cardiovascular negative cardio ROS  Rhythm:Regular Rate:Normal     Neuro/Psych negative neurological ROS  negative psych ROS   GI/Hepatic negative GI ROS, Neg liver ROS,   Endo/Other  Obesity  Renal/GU negative Renal ROS  negative genitourinary   Musculoskeletal negative musculoskeletal ROS (+)   Abdominal (+) + obese,   Peds  Hematology  (+) anemia ,   Anesthesia Other Findings   Reproductive/Obstetrics (+) Pregnancy Previous C/Section                           Anesthesia Physical Anesthesia Plan  ASA: II  Anesthesia Plan: Spinal   Post-op Pain Management:    Induction:   Airway Management Planned: Natural Airway  Additional Equipment:   Intra-op Plan:   Post-operative Plan:   Informed Consent: I have reviewed the patients History and Physical, chart, labs and discussed the procedure including the risks, benefits and alternatives for the proposed anesthesia with the patient or authorized representative who has indicated his/her understanding and acceptance.     Plan Discussed with: CRNA, Anesthesiologist and Surgeon  Anesthesia Plan Comments:         Anesthesia Quick Evaluation

## 2013-12-12 NOTE — Op Note (Signed)
Pre-Operative Diagnosis: 1) 39+0 week intrauterine pregnancy 2) History of prior cesarean section, desires repeat Postoperative Diagnosis: same Procedure: Repeat Low transverse cesarean section Surgeon: Dr. Waynard Reeds Assistant: Dr. Essie Hart Operative Findings: Vigorous female infant in the vertex presentation with a loose nuchal cord with apgars of 9 & 9 weighing 6#15, 3150 gm. Normal appearing ovaries, tubes, and uterus.  Specimen: Placenta for disposal EBL: Total I/O In: 2500 [I.V.:2500] Out: 1450 [Urine:450; Blood:1000]   Procedure:Ms. Arnall is an 33 year old gravida 3 para 1011 at 87 weeks and 0 days estimated gestational age who presents for cesarean section. Following the appropriate informed consent the patient was brought to the operating room where spinal anesthesia was administered and found to be adequate. She was placed in the dorsal supine position with a leftward tilt. She was prepped and draped in the normal sterile fashion. Scalpel was then used to make a Pfannenstiel skin incision which was carried down to the underlying layers of soft tissue to the fascia. The fascia was incised in the midline and the fascial incision was extended laterally with Mayo scissors. The superior aspect of the fascial incision was grasped with Coker clamps x2, tented up and the rectus muscles dissected off sharply with the electrocautery unit area and the same procedure was repeated on the inferior aspect of the fascial incision. The rectus muscles were separated in the midline. The abdominal peritoneum was identified, tented up, entered sharply, and the incision was extended superiorly and inferiorly with good visualization of the bladder. The Alexis retractor was then deployed. The vesicouterine peritoneum was identified, tented up, entered sharply, and the bladder flap was created digitally. Scalpel was then used to make a low transverse incision on the uterus which was extended laterally with both  dissection. The fetal vertex was identified, delivered easily through the uterine incision followed by the body. The infant was bulb suctioned on the operative field cried vigorously, cord was clamped and cut and the infant was passed to the waiting neonatologist. Placenta was then delivered spontaneously, the uterus was cleared of all clot and debris. The uterine incision was repaired with #1 chromic in running locked fashion in a single layer. Ovaries and tubes were inspected and normal. The Alexis retractor was removed. The abdominal peritoneum was reapproximated with 2-0 Vicryl in a running fashion, the rectus muscles was reapproximated with #1 chromic in a running fashion. The fascia was closed with a looped PDS in a running fashion. The skin was closed with 4-0 vicryl in a subcuticular fashion and Dermabond. All sponge lap and needle counts were correct x2. Patient tolerated the procedure well and recovered in stable condition following the procedure.

## 2013-12-13 ENCOUNTER — Encounter (HOSPITAL_COMMUNITY): Payer: Self-pay | Admitting: Obstetrics and Gynecology

## 2013-12-13 LAB — CBC
HCT: 28.9 % — ABNORMAL LOW (ref 36.0–46.0)
HEMOGLOBIN: 9.4 g/dL — AB (ref 12.0–15.0)
MCH: 26.9 pg (ref 26.0–34.0)
MCHC: 32.5 g/dL (ref 30.0–36.0)
MCV: 82.8 fL (ref 78.0–100.0)
PLATELETS: 239 10*3/uL (ref 150–400)
RBC: 3.49 MIL/uL — ABNORMAL LOW (ref 3.87–5.11)
RDW: 16 % — ABNORMAL HIGH (ref 11.5–15.5)
WBC: 16.6 10*3/uL — ABNORMAL HIGH (ref 4.0–10.5)

## 2013-12-13 LAB — BIRTH TISSUE RECOVERY COLLECTION (PLACENTA DONATION)

## 2013-12-13 NOTE — Lactation Note (Signed)
This note was copied from the chart of Doris Ji Fairburn. 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 Old daughter. Had no difficulties. She pumped while working and baby was exclusively breast milk. Going back to work she stated was her hardship, but she plans to stay home longer with this baby and hopes to BF longer as well. Stated this baby is latching great and hearing swallows. Baby is sound a sleep and states she will call if needs anything but at this time is doing great. Has good breast anatomy and mom demonstrated hand expression and colostrum noted. Mom encouraged to feed baby 8-12 times/24 hours and with feeding cues. Mom encouraged to waken baby for feeds.  Educated about newborn behavior. Mom reports + breast changes w/pregnancy. Mom encouraged to do skin-to-skin.Referred to Baby and Me Book in Breastfeeding section Pg. 22-23 for position options and Proper latch demonstration.[facility] brochure given w/resources, support groups and LC services. Patient Name: Doris Mccarthy [id number] Date: 12/13/2013 Reason for consult: Initial assessment   Maternal Data Does the patient have breastfeeding experience prior to this delivery?: Yes  Feeding    LATCH Score/Interventions                      Lactation Tools Discussed/Used     Consult Status Consult Status: Follow-up Date: 12/14/13 Follow-up type: In-patient    Cheron Coryell, Diamond Nickel 12/13/2013, 3:59 AM

## 2013-12-13 NOTE — Progress Notes (Signed)
Subjective: Postpartum Day 2: Cesarean Delivery Patient reports incisional pain, tolerating PO, + flatus and no problems voiding.    Objective: Vital signs in last 24 hours: Temp:  [97.6 F (36.4 C)-98.8 F (37.1 C)] 98.3 F (36.8 C) (09/25 0650) Pulse Rate:  [55-90] 57 (09/25 0650) Resp:  [11-21] 20 (09/25 0650) BP: (91-114)/(47-71) 109/59 mmHg (09/25 0650) SpO2:  [95 %-100 %] 100 % (09/25 0650) Weight:  [93.9 kg (207 lb 0.2 oz)] 93.9 kg (207 lb 0.2 oz) (09/24 1530)  Physical Exam:  General: alert, cooperative and no distress Lochia: appropriate Uterine Fundus: firm Incision: healing well, no significant drainage, no dehiscence, no significant erythema DVT Evaluation: No evidence of DVT seen on physical exam. Negative Homan's sign. No cords or calf tenderness.   Recent Labs  12/11/13 1155 12/13/13 0613  HGB 9.8* 9.4*  HCT 30.6* 28.9*    Assessment/Plan: Status post Cesarean section. Doing well postoperatively.  Continue current care.  Essie Hart STACIA 12/13/2013, 9:15 AM

## 2013-12-14 MED ORDER — OXYCODONE-ACETAMINOPHEN 5-325 MG PO TABS: 1.0000 | ORAL_TABLET | ORAL | Status: AC | PRN

## 2013-12-14 MED ORDER — IBUPROFEN 600 MG PO TABS: 600.0000 mg | ORAL_TABLET | Freq: Four times a day (QID) | ORAL | Status: AC | PRN

## 2013-12-14 MED ORDER — SENNOSIDES-DOCUSATE SODIUM 8.6-50 MG PO TABS: 2.0000 | ORAL_TABLET | Freq: Every evening | ORAL | Status: AC | PRN

## 2013-12-14 NOTE — Progress Notes (Signed)
Subjective: Postpartum Day 2: Cesarean Delivery Patient doing well w/o complaints.  She denies pain, well controlled, eating, voiding, ambulating w/o difficulty +Flatus  Objective: Vital signs in last 24 hours: Temp:  [97.6 F (36.4 C)-98.7 F (37.1 C)] 97.8 F (36.6 C) (09/26 0625) Pulse Rate:  [58-81] 78 (09/26 0625) Resp:  [16-18] 16 (09/26 0625) BP: (104-119)/(52-66) 119/66 mmHg (09/26 0625) SpO2:  [96 %-98 %] 98 % (09/25 1450)  Physical Exam:  General: alert, cooperative, appears stated age and no distress Lochia: appropriate Uterine Fundus: firm Incision: healing well, no significant drainage, no dehiscence, no significant erythema DVT Evaluation: No evidence of DVT seen on physical exam. Negative Homan's sign. No cords or calf tenderness.   Recent Labs  12/11/13 1155 12/13/13 0613  HGB 9.8* 9.4*  HCT 30.6* 28.9*    Assessment/Plan: Status post Cesarean section. Doing well postoperatively.  Discharge home with standard precautions and return to clinic in 4-6 weeks.  Essie Hart STACIA 12/14/2013, 9:20 AM

## 2013-12-14 NOTE — Discharge Summary (Signed)
Obstetric Discharge Summary Reason for Admission: cesarean section Prenatal Procedures: NST Intrapartum Procedures: cesarean: low cervical, transverse Postpartum Procedures: none Complications-Operative and Postpartum: none Hemoglobin  Date Value Ref Range Status  12/13/2013 9.4* 12.0 - 15.0 g/dL Final     HCT  Date Value Ref Range Status  12/13/2013 28.9* 36.0 - 46.0 % Final    Physical Exam:  General: alert, cooperative, appears stated age and no distress Lochia: appropriate Uterine Fundus: firm Incision: healing well, no significant drainage, no dehiscence, no significant erythema DVT Evaluation: No evidence of DVT seen on physical exam. Negative Homan's sign. No cords or calf tenderness.  Discharge Diagnoses: Term Pregnancy-delivered  Discharge Information: Date: 12/14/2013 Activity: pelvic rest Diet: routine Medications: PNV, Ibuprofen, Colace and Percocet Condition: stable Instructions: refer to practice specific booklet Discharge to: home   Newborn Data: Live born female  Birth Weight: 6 lb 15.1 oz (3150 g) APGAR: 9, 9  Home with mother.  Essie Hart STACIA 12/14/2013, 9:25 AM

## 2014-01-20 ENCOUNTER — Encounter (HOSPITAL_COMMUNITY): Payer: Self-pay | Admitting: Obstetrics and Gynecology

## 2022-03-25 DIAGNOSIS — D500 Iron deficiency anemia secondary to blood loss (chronic): Secondary | ICD-10-CM | POA: Diagnosis not present

## 2022-03-25 DIAGNOSIS — Z1211 Encounter for screening for malignant neoplasm of colon: Secondary | ICD-10-CM | POA: Diagnosis not present

## 2022-03-25 DIAGNOSIS — D509 Iron deficiency anemia, unspecified: Secondary | ICD-10-CM | POA: Diagnosis not present

## 2022-03-25 DIAGNOSIS — I10 Essential (primary) hypertension: Secondary | ICD-10-CM | POA: Diagnosis not present

## 2022-04-01 DIAGNOSIS — R82998 Other abnormal findings in urine: Secondary | ICD-10-CM | POA: Diagnosis not present

## 2022-04-01 DIAGNOSIS — I10 Essential (primary) hypertension: Secondary | ICD-10-CM | POA: Diagnosis not present

## 2022-04-08 DIAGNOSIS — R82998 Other abnormal findings in urine: Secondary | ICD-10-CM | POA: Diagnosis not present

## 2022-04-08 DIAGNOSIS — I10 Essential (primary) hypertension: Secondary | ICD-10-CM | POA: Diagnosis not present

## 2022-04-15 DIAGNOSIS — N762 Acute vulvitis: Secondary | ICD-10-CM | POA: Diagnosis not present

## 2022-09-30 DIAGNOSIS — E611 Iron deficiency: Secondary | ICD-10-CM | POA: Diagnosis not present

## 2022-09-30 DIAGNOSIS — I10 Essential (primary) hypertension: Secondary | ICD-10-CM | POA: Diagnosis not present

## 2022-09-30 DIAGNOSIS — Z8249 Family history of ischemic heart disease and other diseases of the circulatory system: Secondary | ICD-10-CM | POA: Diagnosis not present

## 2022-09-30 DIAGNOSIS — N926 Irregular menstruation, unspecified: Secondary | ICD-10-CM | POA: Diagnosis not present

## 2022-09-30 DIAGNOSIS — I499 Cardiac arrhythmia, unspecified: Secondary | ICD-10-CM | POA: Diagnosis not present

## 2022-09-30 DIAGNOSIS — M7122 Synovial cyst of popliteal space [Baker], left knee: Secondary | ICD-10-CM | POA: Diagnosis not present

## 2022-09-30 DIAGNOSIS — Z6830 Body mass index (BMI) 30.0-30.9, adult: Secondary | ICD-10-CM | POA: Diagnosis not present

## 2022-09-30 DIAGNOSIS — G43909 Migraine, unspecified, not intractable, without status migrainosus: Secondary | ICD-10-CM | POA: Diagnosis not present

## 2022-09-30 DIAGNOSIS — E669 Obesity, unspecified: Secondary | ICD-10-CM | POA: Diagnosis not present

## 2022-09-30 DIAGNOSIS — D500 Iron deficiency anemia secondary to blood loss (chronic): Secondary | ICD-10-CM | POA: Diagnosis not present

## 2022-10-14 DIAGNOSIS — Z1231 Encounter for screening mammogram for malignant neoplasm of breast: Secondary | ICD-10-CM | POA: Diagnosis not present

## 2023-03-03 DIAGNOSIS — N920 Excessive and frequent menstruation with regular cycle: Secondary | ICD-10-CM | POA: Diagnosis not present
